# Patient Record
Sex: Male | Born: 1985 | Race: Black or African American | Hispanic: No | Smoking: Never smoker
Health system: Southern US, Community
[De-identification: ages and names within clinical notes are randomized; demographics above are authoritative.]

## PROBLEM LIST (undated history)

## (undated) DIAGNOSIS — F319 Bipolar disorder, unspecified: Secondary | ICD-10-CM

---

## 2016-09-04 ENCOUNTER — Encounter (HOSPITAL_COMMUNITY): Payer: Self-pay | Admitting: Emergency Medicine

## 2016-09-04 DIAGNOSIS — Z79899 Other long term (current) drug therapy: Secondary | ICD-10-CM | POA: Insufficient documentation

## 2016-09-04 DIAGNOSIS — F909 Attention-deficit hyperactivity disorder, unspecified type: Secondary | ICD-10-CM | POA: Insufficient documentation

## 2016-09-04 LAB — CBC WITH DIFFERENTIAL/PLATELET
Basophils Absolute: 0 10*3/uL (ref 0.0–0.1)
Basophils Relative: 0 %
EOS ABS: 0.1 10*3/uL (ref 0.0–0.7)
EOS PCT: 2 %
HCT: 42.3 % (ref 39.0–52.0)
Hemoglobin: 14.6 g/dL (ref 13.0–17.0)
LYMPHS ABS: 2.3 10*3/uL (ref 0.7–4.0)
Lymphocytes Relative: 39 %
MCH: 29.3 pg (ref 26.0–34.0)
MCHC: 34.5 g/dL (ref 30.0–36.0)
MCV: 84.8 fL (ref 78.0–100.0)
MONOS PCT: 5 %
Monocytes Absolute: 0.3 10*3/uL (ref 0.1–1.0)
Neutro Abs: 3.2 10*3/uL (ref 1.7–7.7)
Neutrophils Relative %: 54 %
PLATELETS: 139 10*3/uL — AB (ref 150–400)
RBC: 4.99 MIL/uL (ref 4.22–5.81)
RDW: 13.8 % (ref 11.5–15.5)
WBC: 5.9 10*3/uL (ref 4.0–10.5)

## 2016-09-04 NOTE — ED Triage Notes (Signed)
Pt. requesting psychiatric evaluation for his Bipolar disorder , presents with flight of ideas / unable to focus during encounter ,denies suicidal ideation . No hallucinations .

## 2016-09-04 NOTE — ED Notes (Signed)
Pt approached Nurse first with flight of ideas; Pt stood at desk for a few minutes of rambling; pt redirected to have a seat and wait for Md to evaluate.

## 2016-09-05 ENCOUNTER — Emergency Department (HOSPITAL_COMMUNITY)
Admission: EM | Admit: 2016-09-05 | Discharge: 2016-09-05 | Disposition: A | Discharge status: Home / Self Care | Payer: Self-pay | Attending: Emergency Medicine | Admitting: Emergency Medicine

## 2016-09-05 DIAGNOSIS — F909 Attention-deficit hyperactivity disorder, unspecified type: Secondary | ICD-10-CM

## 2016-09-05 LAB — COMPREHENSIVE METABOLIC PANEL
ALK PHOS: 62 U/L (ref 38–126)
ALT: 17 U/L (ref 17–63)
ANION GAP: 10 (ref 5–15)
AST: 19 U/L (ref 15–41)
Albumin: 4.2 g/dL (ref 3.5–5.0)
BUN: 12 mg/dL (ref 6–20)
CALCIUM: 9.5 mg/dL (ref 8.9–10.3)
CHLORIDE: 105 mmol/L (ref 101–111)
CO2: 23 mmol/L (ref 22–32)
Creatinine, Ser: 0.81 mg/dL (ref 0.61–1.24)
GFR calc non Af Amer: >60 mL/min (ref >60)
GLUCOSE: 97 mg/dL (ref 65–99)
POTASSIUM: 3.6 mmol/L (ref 3.5–5.1)
Sodium: 138 mmol/L (ref 135–145)
Total Bilirubin: 1.5 mg/dL — ABNORMAL HIGH (ref 0.3–1.2)
Total Protein: 7.3 g/dL (ref 6.5–8.1)

## 2016-09-05 LAB — RAPID URINE DRUG SCREEN, HOSP PERFORMED
AMPHETAMINES: NOT DETECTED
BENZODIAZEPINES: NOT DETECTED
Barbiturates: NOT DETECTED
COCAINE: NOT DETECTED
OPIATES: NOT DETECTED
Tetrahydrocannabinol: POSITIVE — AB

## 2016-09-05 LAB — ETHANOL

## 2016-09-05 NOTE — ED Notes (Signed)
Pt manic, pacing in and out of room, advised he had been discharged and  redirected to exit.  Cooperative, pleasant, non violent.

## 2016-09-05 NOTE — ED Provider Notes (Signed)
[facility]-EMERGENCY DEPT Provider Note   CSN: [id number] Arrival date & time: 09/04/16  2255    History   Chief Complaint Chief Complaint  Patient presents with  . Bipolar    HPI Revel Stellmach is a 31 y.o. male.  31 year old male presents to the emergency department for evaluation. The patient's motive for presenting is unclear. He states that he wants to "get checked out". He has rapid speech and is hyperactive. Thoughts are tangential with flight of ideas. Patient denies any complaints of pain. He denies suicidal and homicidal ideations. No auditory or visual hallucinations. He states that he does not wish to speak with a psychiatrist or counselor. He denies being on any daily medications.      History reviewed. No pertinent past medical history.  There are no active problems to display for this patient.   History reviewed. No pertinent surgical history.    Home Medications    Prior to Admission medications   Not on File    Family History No family history on file.  Social History Social History  Substance Use Topics  . Smoking status: Never Smoker  . Smokeless tobacco: Never Used  . Alcohol use No     Allergies   Patient has no known allergies.   Review of Systems Review of Systems Ten systems reviewed and are negative for acute change, except as noted in the HPI.    Physical Exam Updated Vital Signs BP 134/76 (BP Location: Right Arm)   Pulse 62   Temp 97.9 F (36.6 C) (Oral)   Resp 18   SpO2 100%   Physical Exam  Constitutional: He is oriented to person, place, and time. He appears well-developed and well-nourished. No distress.  Nontoxic appearing and in NAD  HENT:  Head: Normocephalic and atraumatic.  Eyes: Conjunctivae and EOM are normal. No scleral icterus.  Neck: Normal range of motion.  Cardiovascular: Normal rate, regular rhythm and intact distal pulses.   Pulmonary/Chest: Effort normal. No respiratory distress. He has no wheezes. He  has no rales.  Lungs CTAB  Musculoskeletal: Normal range of motion.  Neurological: He is alert and oriented to person, place, and time. He exhibits normal muscle tone. Coordination normal.  GCS 15. Ambulatory with steady gait.  Skin: Skin is warm and dry. No rash noted. He is not diaphoretic. No erythema. No pallor.  Psychiatric: His speech is rapid and/or pressured and tangential. He is hyperactive. Cognition and memory are normal. He expresses no homicidal and no suicidal ideation.  Nursing note and vitals reviewed.    ED Treatments / Results  Labs (all labs ordered are listed, but only abnormal results are displayed) Labs Reviewed  RAPID URINE DRUG SCREEN, HOSP PERFORMED - Abnormal; Notable for the following:       Result Value   Tetrahydrocannabinol POSITIVE (*)    All other components within normal limits  CBC WITH DIFFERENTIAL/PLATELET - Abnormal; Notable for the following:    Platelets 139 (*)    All other components within normal limits  COMPREHENSIVE METABOLIC PANEL - Abnormal; Notable for the following:    Total Bilirubin 1.5 (*)    All other components within normal limits  ETHANOL    EKG  EKG Interpretation None       Radiology No results found.  Procedures Procedures (including critical care time)  Medications Ordered in ED Medications - No data to display   Initial Impression / Assessment and Plan / ED Course  I have reviewed the triage vital  signs and the nursing notes.  Pertinent labs & imaging results that were available during my care of the patient were reviewed by me and considered in my medical decision making (see chart for details).     31 year old male presents to the emergency department for medical evaluation. He is noted to be hyperactive with flight of ideas. This is likely secondary to an underlying psychiatric disorder. Patient denies suicidal and homicidal ideations. I do not appreciate him to be a harm to himself or others. He has  had a reassuring laboratory workup.   The patient declines to speak with a psychiatrist or counselor. I do not believe the patient warrants involuntary commitment. He has been provided a Facilities manager for outpatient primary care follow-up and psychiatric care if desired. Return precautions given at discharge. Patient ambulated out of the department in stable condition.   Final Clinical Impressions(s) / ED Diagnoses   Final diagnoses:  Hyperactive behavior    New Prescriptions There are no discharge medications for this patient.    Antony Madura, PA-C 09/05/16 1610    Jacalyn Lefevre, MD 09/05/16 [phone]

## 2016-09-09 ENCOUNTER — Encounter (HOSPITAL_COMMUNITY): Payer: Self-pay | Admitting: Emergency Medicine

## 2016-09-09 ENCOUNTER — Emergency Department (HOSPITAL_COMMUNITY): Payer: Self-pay

## 2016-09-09 ENCOUNTER — Emergency Department (HOSPITAL_COMMUNITY)
Admission: EM | Admit: 2016-09-09 | Discharge: 2016-09-11 | Disposition: A | Discharge status: Home / Self Care | Payer: Self-pay

## 2016-09-09 DIAGNOSIS — Z5181 Encounter for therapeutic drug level monitoring: Secondary | ICD-10-CM | POA: Insufficient documentation

## 2016-09-09 DIAGNOSIS — F3110 Bipolar disorder, current episode manic without psychotic features, unspecified: Secondary | ICD-10-CM | POA: Diagnosis present

## 2016-09-09 DIAGNOSIS — F22 Delusional disorders: Secondary | ICD-10-CM

## 2016-09-09 DIAGNOSIS — F3111 Bipolar disorder, current episode manic without psychotic features, mild: Secondary | ICD-10-CM | POA: Insufficient documentation

## 2016-09-09 DIAGNOSIS — R9431 Abnormal electrocardiogram [ECG] [EKG]: Secondary | ICD-10-CM | POA: Insufficient documentation

## 2016-09-09 LAB — CBC
HCT: 39.6 % (ref 39.0–52.0)
Hemoglobin: 14 g/dL (ref 13.0–17.0)
MCH: 29.9 pg (ref 26.0–34.0)
MCHC: 35.4 g/dL (ref 30.0–36.0)
MCV: 84.6 fL (ref 78.0–100.0)
PLATELETS: 148 10*3/uL — AB (ref 150–400)
RBC: 4.68 MIL/uL (ref 4.22–5.81)
RDW: 13.1 % (ref 11.5–15.5)
WBC: 6.1 10*3/uL (ref 4.0–10.5)

## 2016-09-09 LAB — BASIC METABOLIC PANEL
ANION GAP: 8 (ref 5–15)
BUN: 12 mg/dL (ref 6–20)
CALCIUM: 9.3 mg/dL (ref 8.9–10.3)
CO2: 25 mmol/L (ref 22–32)
CREATININE: 0.86 mg/dL (ref 0.61–1.24)
Chloride: 105 mmol/L (ref 101–111)
GLUCOSE: 105 mg/dL — AB (ref 65–99)
POTASSIUM: 3.5 mmol/L (ref 3.5–5.1)
Sodium: 138 mmol/L (ref 135–145)

## 2016-09-09 LAB — SALICYLATE LEVEL

## 2016-09-09 LAB — I-STAT TROPONIN, ED: TROPONIN I, POC: 0 ng/mL (ref 0.00–0.08)

## 2016-09-09 LAB — ACETAMINOPHEN LEVEL: Acetaminophen (Tylenol), Serum: <10 ug/mL — ABNORMAL LOW (ref 10–30)

## 2016-09-09 LAB — ETHANOL: Alcohol, Ethyl (B): <5 mg/dL (ref <5)

## 2016-09-09 MED ORDER — LORAZEPAM 2 MG/ML IJ SOLN
1.0000 mg | Freq: Once | INTRAMUSCULAR | Status: DC
  Filled 2016-09-09: qty 1
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
## 2016-09-09 NOTE — ED Notes (Signed)
Writer states they went into patients room to introduce themselves and let patient know about what staff needed to do to help patient. One being that we needed to put a cardiac monitor on patient. He refused and said he did not need to be "watched".

## 2016-09-10 DIAGNOSIS — F3110 Bipolar disorder, current episode manic without psychotic features, unspecified: Secondary | ICD-10-CM | POA: Diagnosis present

## 2016-09-10 LAB — RAPID URINE DRUG SCREEN, HOSP PERFORMED
AMPHETAMINES: NOT DETECTED
Barbiturates: NOT DETECTED
Benzodiazepines: NOT DETECTED
Cocaine: NOT DETECTED
Opiates: NOT DETECTED
TETRAHYDROCANNABINOL: POSITIVE — AB

## 2016-09-10 MED ORDER — HALOPERIDOL LACTATE 5 MG/ML IJ SOLN
5.0000 mg | Freq: Once | INTRAMUSCULAR | Status: AC
  Administered 2016-09-10: 5 mg via INTRAMUSCULAR
  Filled 2016-09-10: qty 1

## 2016-09-10 MED ORDER — LORAZEPAM 2 MG/ML IJ SOLN
2.0000 mg | Freq: Once | INTRAMUSCULAR | Status: AC
  Administered 2016-09-10: 2 mg via INTRAVENOUS

## 2016-09-10 MED ORDER — LORAZEPAM 2 MG/ML IJ SOLN: 2.0000 mg | Freq: Once | INTRAMUSCULAR | Status: DC

## 2016-09-10 MED ORDER — LORAZEPAM 1 MG PO TABS: 1.0000 mg | ORAL_TABLET | Freq: Three times a day (TID) | ORAL | Status: DC | PRN

## 2016-09-10 MED ORDER — DIPHENHYDRAMINE HCL 50 MG/ML IJ SOLN
50.0000 mg | Freq: Once | INTRAMUSCULAR | Status: DC | PRN
Start: 2016-09-10 — End: 2016-09-11

## 2016-09-10 MED ORDER — DIPHENHYDRAMINE HCL 50 MG/ML IJ SOLN: 50.0000 mg | Freq: Once | INTRAMUSCULAR | Status: DC

## 2016-09-10 MED ORDER — ASENAPINE MALEATE 5 MG SL SUBL
10.0000 mg | SUBLINGUAL_TABLET | Freq: Two times a day (BID) | SUBLINGUAL | Status: DC
  Filled 2016-09-10 (×2): qty 2

## 2016-09-10 MED ORDER — ZIPRASIDONE MESYLATE 20 MG IM SOLR: 20.0000 mg | Freq: Once | INTRAMUSCULAR | Status: DC | PRN

## 2016-09-10 MED ORDER — LORAZEPAM 2 MG/ML IJ SOLN: 2.0000 mg | Freq: Once | INTRAMUSCULAR | Status: DC | PRN

## 2016-09-10 MED ORDER — ZIPRASIDONE MESYLATE 20 MG IM SOLR: 20.0000 mg | Freq: Once | INTRAMUSCULAR | Status: DC

## 2016-09-10 MED ORDER — GABAPENTIN 100 MG PO CAPS
200.0000 mg | ORAL_CAPSULE | Freq: Two times a day (BID) | ORAL | Status: DC
  Filled 2016-09-10 (×2): qty 2

## 2016-09-10 NOTE — ED Notes (Signed)
On the phone 

## 2016-09-10 NOTE — ED Notes (Signed)
Bed: W[facility]37 Expected date:  Expected time:  Means of arrival:  Comments: Room 30 

## 2016-09-10 NOTE — ED Notes (Signed)
Eating supper.

## 2016-09-10 NOTE — ED Notes (Signed)
Report given to [employer]. Pt transferred.

## 2016-09-10 NOTE — ED Notes (Signed)
Up talking to [facility] in hall

## 2016-09-10 NOTE — ED Notes (Signed)
Up to the desk wanting to use his lap top, to know how long he will be here, pt them turned and returned to his room.

## 2016-09-10 NOTE — ED Notes (Signed)
ekg report given to provider
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
## 2016-09-10 NOTE — ED Notes (Signed)
Snack give, pt laying on bed w/ eyes closed, resp even and unlabored

## 2016-09-10 NOTE — ED Notes (Signed)
Up tot he bathroom to shower and change scrubs 

## 2016-09-10 NOTE — ED Notes (Signed)
Pt became manic and was IVC by provider

## 2016-09-10 NOTE — BH Assessment (Signed)
[facility] Assessment Progress Note  Per Thedore Mins, MD, this pt requires psychiatric hospitalization at this time.  The following facilities have been contacted to seek placement for this pt, with results as noted:  Beds available, information sent, decision pending:  [facility]   At capacity:  [facility] (no male beds) Paulette Blanch, [place] Triage Specialist [phone]
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
## 2016-09-10 NOTE — Progress Notes (Signed)
09/10/16 1354:  LRT went to pt room to offer activities, pt was sleep.  Caroll Rancher, LRT/CTRS

## 2016-09-10 NOTE — ED Notes (Addendum)
Up to the bathroom, ambulatory w/o difficulty 

## 2016-09-10 NOTE — ED Notes (Addendum)
Pt laying quietly on bed w/ eyes closed.  Pt had been escorted to room 37 by security and [employer] and had declined to take his medications.  When asked about taking medications pt did not respond to question.  Pt declined to answer questions other than he needed another blanket, when he indicated that he did by nodding his head.

## 2016-09-10 NOTE — ED Notes (Signed)
David Gay, TTS, in evaluating pt

## 2016-09-10 NOTE — ED Notes (Signed)
Bed: WA30 Expected date:  Expected time:  Means of arrival:  Comments: Hold for 15 

## 2016-09-10 NOTE — ED Notes (Signed)
Pt awake, alert & responsive, no distress noted, calm at present.  Pt rambling during conversation.  Monitoring for safety, Q 15 min checks in effect.
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
## 2016-09-10 NOTE — ED Notes (Signed)
Bed: WBH38 Expected date:  Expected time:  Means of arrival:  Comments: Seclusion room 

## 2016-09-10 NOTE — ED Provider Notes (Signed)
Pt now walking around ER, yelling/agitated, a threat to himself and others  IVC completed Haldol/ativan ordered for patient   CRITICAL CARE Performed by: Joya Gaskins Total critical care time: 31 minutes Critical care time was exclusive of separately billable procedures and treating other patients. Critical care was necessary to treat or prevent imminent or life-threatening deterioration. Critical care was time spent personally by me on the following activities: development of treatment plan with patient and/or surrogate as well as nursing, discussions with consultants, evaluation of patient's response to treatment, examination of patient, obtaining history from patient or surrogate, ordering and performing treatments and interventions, ordering and review of laboratory studies, ordering and review of radiographic studies, pulse oximetry and re-evaluation of patient's condition.    Zadie Rhine, MD 09/10/16 [phone]

## 2016-09-10 NOTE — BH Assessment (Addendum)
Tele Assessment Note   David Gay is an 31 y.o. male.  -Clinician reviewed note by Dr. Bebe Shaggy. David Gay is a 31 y.o. male with hx of bipolar who presents to the Emergency Department bib [facility] for a mental evaluation with onset x2 hours. 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 Pt states that "they say I'm bipolar but I'm not." He states that he is similar to Solis in the fact that his mind works differently. Pt denies any modifying factors. He denies chest pain. Of note, pt states that he has not slept in x5 days and called his mom for money for a hotel room.  Patient has pressured, rapid speech.  He has to slow speech at times to be understood.  Patient has no coherence in what he describes happened today.  He mentioned a stranger picking him up today and taking him to the [employer] store.  He describes "going ape-shit" on an employee at the [employer].  Later in the day as he was heading to [employer] he says his phone and Facebook page "got zapped."  He believes that other people are responsible for this.  Patient is separated from his wife of 2.5 years.  Patient says that there is a court date of May 26 regarding his wife.  Patient tears up a bit talking about her.  Patient has a restraining order from her but he is unclear on this.  Patient denies any SI, HI or hearing or seeing things.  He is however very manic, speaks rapidly and incoherently.  He talks about how others are jealous of him.  He also can make people millions of dollars if they listen to his ideas.  He talks about how he is the smartest person around and how he will end up working for [employer].  Patient talks positively about using marijuana but he cannot concentrate long enough to tell when the last time he used it was.    Patient recounts the different times he has gotten fired from jobs.  Patient has had previous  hospitalizations but cannot name when or where readily.   Patient has no current outpatient provider at this time and is not on any psychiatric drugs.  Patient says he has had a previous dx of bipolar d/o.  Patient was in [facility] on 09/05/16 because he had no place else to go.  -Clinician discussed patient care with Donell Sievert, PA who recommends inpatient care.  TTS to seek placement.  Diagnosis: Bipolar d/o mania  Past Medical History: History reviewed. No pertinent past medical history.  History reviewed. No pertinent surgical history.  Family History: No family history on file.  Social History:  reports that he has never smoked. He has never used smokeless tobacco. He reports that he does not drink alcohol or use drugs.  Additional Social History:  Alcohol / Drug Use Pain Medications: None Prescriptions: "I hate medication" Over the Counter: None History of alcohol / drug use?: Yes Substance #1 Name of Substance 1: Marijuana 1 - Age of First Use: 31 years of age 5 - Amount (size/oz): A blunt when I can get it 1 - Frequency: Varies 1 - Duration: ongoing 1 - Last Use / Amount: Won't say.  CIWA: CIWA-Ar BP: 129/75 Pulse Rate: (!) 55 COWS:    PATIENT STRENGTHS: (choose at least two) Average or above average intelligence Capable  of independent living Communication skills Supportive family/friends  Allergies: No Known Allergies  Home Medications:  (Not in a hospital admission)  OB/GYN Status:  No LMP for male patient.  General Assessment Data Location of Assessment: [facility] TTS Assessment: In system Is this a Tele or Face-to-Face Assessment?: Face-to-Face Is this an Initial Assessment or a Re-assessment for this encounter?: Initial Assessment Marital status: Separated Is patient pregnant?: No Pregnancy Status: No Living Arrangements: Other (Comment) (Homeless currently) Can pt return to current living arrangement?: Yes Admission Status: Voluntary Is patient  capable of signing voluntary admission?: Yes Referral Source: Self/Family/Friend (Patient had contacted EMS to come to [facility] Insurance type: self pay     Crisis Care Plan Living Arrangements: Other (Comment) (Homeless currently) Name of Psychiatrist: None Name of Therapist: None  Education Status Is patient currently in school?: No Highest grade of school patient has completed: Some college  Risk to self with the past 6 months Suicidal Ideation: No Has patient been a risk to self within the past 6 months prior to admission? : No Suicidal Intent: No Has patient had any suicidal intent within the past 6 months prior to admission? : No Is patient at risk for suicide?: No Suicidal Plan?: No Has patient had any suicidal plan within the past 6 months prior to admission? : No Access to Means: No What has been your use of drugs/alcohol within the last 12 months?: THC Previous Attempts/Gestures: No How many times?: 0 Other Self Harm Risks: None Triggers for Past Attempts: None known Intentional Self Injurious Behavior: None Family Suicide History: No Recent stressful life event(s): Divorce, Turmoil (Comment) (Presents with paranoid thoughts) Persecutory voices/beliefs?: Yes Depression: Yes Depression Symptoms: Loss of interest in usual pleasures, Insomnia, Tearfulness Substance abuse history and/or treatment for substance abuse?: No Suicide prevention information given to non-admitted patients: Not applicable  Risk to Others within the past 6 months Homicidal Ideation: No Does patient have any lifetime risk of violence toward others beyond the six months prior to admission? : No Thoughts of Harm to Others: No Current Homicidal Intent: No Current Homicidal Plan: No Access to Homicidal Means: No Identified Victim: No one History of harm to others?: No Assessment of Violence: None Noted Violent Behavior Description: Pt denies Does patient have access to weapons?: No Criminal  Charges Pending?: No Does patient have a court date: No Is patient on probation?: No  Psychosis Hallucinations: None noted Delusions: Persecutory, Grandiose  Mental Status Report Appearance/Hygiene: In scrubs Eye Contact: Good Motor Activity: Freedom of movement, Unremarkable Speech: Incoherent, Rapid, Pressured Level of Consciousness: Alert Mood: Anxious, Suspicious, Apprehensive, Preoccupied Affect: Anxious, Apprehensive, Preoccupied Anxiety Level: Moderate Thought Processes: Irrelevant, Flight of Ideas Judgement: Unimpaired Orientation: Person, Place Obsessive Compulsive Thoughts/Behaviors: Severe  Cognitive Functioning Concentration: Normal Memory: Remote Intact, Recent Impaired IQ: Average Insight: Poor Impulse Control: Poor Appetite: Good Weight Loss: 0 Weight Gain: 0 Sleep: Decreased Total Hours of Sleep:  (Off and on <5H/D) Vegetative Symptoms: None  ADLScreening [facility] Assessment Services) Patient's cognitive ability adequate to safely complete daily activities?: Yes Patient able to express need for assistance with ADLs?: Yes Independently performs ADLs?: Yes (appropriate for developmental age)  Prior Inpatient Therapy Prior Inpatient Therapy: Yes Prior Therapy Dates: Pt unclear Prior Therapy Facilty/Provider(s): Unknown Reason for Treatment: Pt has psychosis  Prior Outpatient Therapy Prior Outpatient Therapy: No Prior Therapy Dates: None Prior Therapy Facilty/Provider(s): None Reason for Treatment: None Does patient have an ACCT team?: No Does patient have Intensive In-House Services?  : No  Does patient have [facility] services? : No Does patient have P4CC services?: No  ADL Screening (condition at time of admission) Patient's cognitive ability adequate to safely complete daily activities?: Yes Is the patient deaf or have difficulty hearing?: No Does the patient have difficulty seeing, even when wearing glasses/contacts?: No Does the patient have  difficulty concentrating, remembering, or making decisions?: No Patient able to express need for assistance with ADLs?: Yes Does the patient have difficulty dressing or bathing?: No Independently performs ADLs?: Yes (appropriate for developmental age) Does the patient have difficulty walking or climbing stairs?: No Weakness of Legs: None Weakness of Arms/Hands: None       Abuse/Neglect Assessment (Assessment to be complete while patient is alone) Physical Abuse: Denies Verbal Abuse: Denies Sexual Abuse: Denies Exploitation of patient/patient's resources: Denies Self-Neglect: Denies     Merchant navy officer (For Healthcare) Does Patient Have a Medical Advance Directive?: No Would patient like information on creating a medical advance directive?: No - Patient declined    Additional Information 1:1 In Past 12 Months?: No CIRT Risk: No Elopement Risk: No Does patient have medical clearance?: Yes     Disposition:  Disposition Initial Assessment Completed for this Encounter: Yes Disposition of Patient: Other dispositions Other disposition(s): Other (Comment) (Pt to be reviewed by PA)  Alexandria Lodge 09/10/2016 2:19 AM

## 2016-09-10 NOTE — ED Notes (Signed)
Parents Chrissie Noa and Jayvion Stefanski called from the [facility] requesting that the patient be IVC'd, it was explained to them that our ED physician had to evaluate and determine if that was necessary at this time.   David Gay [phone] father

## 2016-09-10 NOTE — ED Notes (Signed)
Pt awake, no c/o pain. Pt states he needs all of his media devices. Explained to patient that these items have been locked up and secure. Pt made aware of the policy regarding media devices and IVC'd patients. Pt agitated and difficulty focusing on tasks.

## 2016-09-10 NOTE — ED Notes (Signed)
Pt gowned in paper scrubs, wand ed by security and items collected and bagged per IVC

## 2016-09-10 NOTE — ED Notes (Signed)
Pt refused to take his scheduled meds , pt is manic cussing at this Clinical research associate. Pt using word salad obviously manic. Verbally and physically aggressive. [employer] at bedside. Pt will be moved to [facility].

## 2016-09-11 DIAGNOSIS — F3111 Bipolar disorder, current episode manic without psychotic features, mild: Secondary | ICD-10-CM

## 2016-09-11 MED ORDER — GABAPENTIN 100 MG PO CAPS: 200.0000 mg | ORAL_CAPSULE | Freq: Two times a day (BID) | ORAL | 0 refills | Status: AC

## 2016-09-11 MED ORDER — ASENAPINE MALEATE 5 MG SL SUBL: 10.0000 mg | SUBLINGUAL_TABLET | Freq: Two times a day (BID) | SUBLINGUAL | 0 refills | Status: AC

## 2016-09-11 NOTE — BH Assessment (Signed)
[facility] Assessment Progress Note  Per Thedore Mins, MD, this pt does not require psychiatric hospitalization at this time.  Pt presents under IVC initiated by EDP Zadie Rhine, MD, which Dr Jannifer Franklin has rescinded.  Pt is to be discharged from [facility] with recommendation to follow up with [facility].  This has been included in pt's discharge instructions.  Pt's nurse, Dawnaly, has been notified.  Doylene Canning, MA Triage Specialist [phone]

## 2016-09-11 NOTE — Consult Note (Signed)
[facility] Face-to-Face Psychiatry Consult   Reason for Consult:  Delusional,  paranoia Referring Physician:  EDP Patient Identification: David Gay MRN:  [id number] Principal Diagnosis: Bipolar affective disorder, current episode manic ([place]) Diagnosis:   Patient Active Problem List   Diagnosis Date Noted  . Bipolar affective disorder, current episode manic ([place]) [F31.9] 09/10/2016    Priority: Medium    Total Time spent with patient: 45 minutes  Subjective:   David Gay is a 31 y.o. male patient does not warrant admission.  HPI:  31 yo male who presented to the ED with delusions of grandeur and increase in activity.  He claims he was requesting help from the police for theft and they sent him here.  David Gay was started on medications and he stabilized.  No voices or hallucinations or paranoia.  No suicidal/homicidal ideations.  A bit grandiose with increase energy but stable for discharge.  Past Psychiatric History: bipolar disorder  Risk to Self: Suicidal Ideation: No Suicidal Intent: No Is patient at risk for suicide?: No Suicidal Plan?: No Access to Means: No What has been your use of drugs/alcohol within the last 12 months?: THC How many times?: 0 Other Self Harm Risks: None Triggers for Past Attempts: None known Intentional Self Injurious Behavior: None Risk to Others: Homicidal Ideation: No Thoughts of Harm to Others: No Current Homicidal Intent: No Current Homicidal Plan: No Access to Homicidal Means: No Identified Victim: No one History of harm to others?: No Assessment of Violence: None Noted Violent Behavior Description: Pt denies Does patient have access to weapons?: No Criminal Charges Pending?: No Does patient have a court date: No Prior Inpatient Therapy: Prior Inpatient Therapy: Yes Prior Therapy Dates: Pt unclear Prior Therapy Facilty/Provider(s): Unknown Reason for Treatment: Pt has psychosis Prior Outpatient Therapy: Prior Outpatient Therapy:  No Prior Therapy Dates: None Prior Therapy Facilty/Provider(s): None Reason for Treatment: None Does patient have an ACCT team?: No Does patient have Intensive In-House Services?  : No Does patient have [facility] services? : No Does patient have P4CC services?: No  Past Medical History: History reviewed. No pertinent past medical history. History reviewed. No pertinent surgical history. Family History: No family history on file. Family Psychiatric  History: unknown Social History:  History  Alcohol Use No     History  Drug Use No    Social History   Social History  . Marital status: Unknown    Spouse name: N/A  . Number of children: N/A  . Years of education: N/A   Social History Main Topics  . Smoking status: Never Smoker  . Smokeless tobacco: Never Used  . Alcohol use No  . Drug use: No  . Sexual activity: Not Asked   Other Topics Concern  . None   Social History Narrative  . None   Additional Social History:    Allergies:  No Known Allergies  Labs:  Results for orders placed or performed during the hospital encounter of 09/09/16 (from the past 48 hour(s))  Basic metabolic panel     Status: Abnormal   Collection Time: 09/09/16 10:40 PM  Result Value Ref Range   Sodium 138 135 - 145 mmol/L   Potassium 3.5 3.5 - 5.1 mmol/L   Chloride 105 101 - 111 mmol/L   CO2 25 22 - 32 mmol/L   Glucose, Bld 105 (H) 65 - 99 mg/dL   BUN 12 6 - 20 mg/dL   Creatinine, Ser 0.86 0.61 - 1.24 mg/dL   Calcium 9.3 8.9 - 10.3  mg/dL   GFR calc non Af Amer >60 >60 mL/min   GFR calc Af Amer >60 >60 mL/min    Comment: (NOTE) The eGFR has been calculated using the CKD EPI equation. This calculation has not been validated in all clinical situations. eGFR's persistently <60 mL/min signify possible Chronic Kidney Disease.    Anion gap 8 5 - 15  CBC     Status: Abnormal   Collection Time: 09/09/16 10:40 PM  Result Value Ref Range   WBC 6.1 4.0 - 10.5 K/uL   RBC 4.68 4.22 - 5.81  MIL/uL   Hemoglobin 14.0 13.0 - 17.0 g/dL   HCT 39.6 39.0 - 52.0 %   MCV 84.6 78.0 - 100.0 fL   MCH 29.9 26.0 - 34.0 pg   MCHC 35.4 30.0 - 36.0 g/dL   RDW 13.1 11.5 - 15.5 %   Platelets 148 (L) 150 - 400 K/uL  I-stat troponin, ED     Status: None   Collection Time: 09/09/16 10:55 PM  Result Value Ref Range   Troponin i, poc 0.00 0.00 - 0.08 ng/mL   Comment 3            Comment: Due to the release kinetics of cTnI, a negative result within the first hours of the onset of symptoms does not rule out myocardial infarction with certainty. If myocardial infarction is still suspected, repeat the test at appropriate intervals.   Ethanol     Status: None   Collection Time: 09/09/16 11:10 PM  Result Value Ref Range   Alcohol, Ethyl (B) <5 <5 mg/dL    Comment:        LOWEST DETECTABLE LIMIT FOR SERUM ALCOHOL IS 5 mg/dL FOR MEDICAL PURPOSES ONLY   Salicylate level     Status: None   Collection Time: 09/09/16 11:10 PM  Result Value Ref Range   Salicylate Lvl <4.0 2.8 - 30.0 mg/dL  Acetaminophen level     Status: Abnormal   Collection Time: 09/09/16 11:10 PM  Result Value Ref Range   Acetaminophen (Tylenol), Serum <10 (L) 10 - 30 ug/mL    Comment:        THERAPEUTIC CONCENTRATIONS VARY SIGNIFICANTLY. A RANGE OF 10-30 ug/mL MAY BE AN EFFECTIVE CONCENTRATION FOR MANY PATIENTS. HOWEVER, SOME ARE BEST TREATED AT CONCENTRATIONS OUTSIDE THIS RANGE. ACETAMINOPHEN CONCENTRATIONS >150 ug/mL AT 4 HOURS AFTER INGESTION AND >50 ug/mL AT 12 HOURS AFTER INGESTION ARE OFTEN ASSOCIATED WITH TOXIC REACTIONS.   Rapid urine drug screen (hospital performed)     Status: Abnormal   Collection Time: 09/10/16 12:26 AM  Result Value Ref Range   Opiates NONE DETECTED NONE DETECTED   Cocaine NONE DETECTED NONE DETECTED   Benzodiazepines NONE DETECTED NONE DETECTED   Amphetamines NONE DETECTED NONE DETECTED   Tetrahydrocannabinol POSITIVE (A) NONE DETECTED   Barbiturates NONE DETECTED NONE DETECTED     Comment:        DRUG SCREEN FOR MEDICAL PURPOSES ONLY.  IF CONFIRMATION IS NEEDED FOR ANY PURPOSE, NOTIFY LAB WITHIN 5 DAYS.        LOWEST DETECTABLE LIMITS FOR URINE DRUG SCREEN Drug Class       Cutoff (ng/mL) Amphetamine      1000 Barbiturate      200 Benzodiazepine   814 Tricyclics       481 Opiates          300 Cocaine          300 THC  50     Current Facility-Administered Medications  Medication Dose Route Frequency Provider Last Rate Last Dose  . asenapine (SAPHRIS) sublingual tablet 10 mg  10 mg Sublingual BID  , MD      . diphenhydrAMINE (BENADRYL) injection 50 mg  50 mg Intramuscular Once PRN Patrecia Pour, NP      . gabapentin (NEURONTIN) capsule 200 mg  200 mg Oral BID  , MD      . LORazepam (ATIVAN) injection 2 mg  2 mg Intramuscular Once PRN Patrecia Pour, NP      . ziprasidone (GEODON) injection 20 mg  20 mg Intramuscular Once PRN Patrecia Pour, NP       No current outpatient prescriptions on file.    Musculoskeletal: Strength & Muscle Tone: within normal limits Gait & Station: normal Patient leans: N/A  Psychiatric Specialty Exam: Physical Exam  Constitutional: He is oriented to person, place, and time. He appears well-developed and well-nourished.  HENT:  Head: Normocephalic.  Neck: Normal range of motion.  Respiratory: Effort normal.  Musculoskeletal: Normal range of motion.  Neurological: He is alert and oriented to person, place, and time.  Psychiatric: He has a normal mood and affect. His speech is normal and behavior is normal. Judgment and thought content normal. Cognition and memory are normal.    Review of Systems  All other systems reviewed and are negative.   Blood pressure 129/77, pulse (!) 58, temperature 97.8 F (36.6 C), temperature source Oral, resp. rate 18, SpO2 99 %.There is no height or weight on file to calculate BMI.  General Appearance: Casual  Eye Contact:  Good  Speech:  Normal  Rate  Volume:  Normal  Mood:  Euphoric  Affect:  Congruent  Thought Process:  Coherent and Descriptions of Associations: Intact  Orientation:  Full (Time, Place, and Person)  Thought Content:  WDL and Logical  Suicidal Thoughts:  No  Homicidal Thoughts:  No  Memory:  Immediate;   Good Recent;   Good Remote;   Good  Judgement:  Fair  Insight:  Fair  Psychomotor Activity:  Normal  Concentration:  Concentration: Good and Attention Span: Good  Recall:  Good  Fund of Knowledge:  Good  Language:  Good  Akathisia:  No  Handed:  Right  AIMS (if indicated):     Assets:  Leisure Time Physical Health Resilience Social Support  ADL's:  Intact  Cognition:  WNL  Sleep:        Treatment Plan Summary: Daily contact with patient to assess and evaluate symptoms and progress in treatment, Medication management and Plan bipolar affective disorder, mania, mild:  -Crisis stabilization -Medication management:  Continued Saphris 5 mg BID for mood stabilization and gabapentin 200 mg BID for mood/anxiety -Individual counseling  Disposition: No evidence of imminent risk to self or others at present.    Waylan Boga, NP 09/11/2016 11:09 AM  Patient seen face-to-face for psychiatric evaluation, chart reviewed and case discussed with the physician extender and developed treatment plan. Reviewed the information documented and agree with the treatment plan. Corena Pilgrim, MD

## 2016-09-11 NOTE — Discharge Instructions (Signed)
For your ongoing mental health needs, you are advised to follow up with [facility].  New and returning patients are seen at their walk-in clinic.  Walk-in hours are Monday - Friday from 8:00 am - 3:00 pm.  Walk-in patients are seen on a first come, first served basis.  Try to arrive as early as possible for he best chance of being seen the same day: ° °     [facility] °     [address] °     [place], [place] [address] °     [phone] °

## 2016-09-11 NOTE — [facility] Suicide Risk Assessment (Signed)
Suicide Risk Assessment  Discharge Assessment   [facility] Discharge Suicide Risk Assessment   Principal Problem: Bipolar affective disorder, current episode manic [facility]) Discharge Diagnoses:  Patient Active Problem List   Diagnosis Date Noted  . Bipolar affective disorder, current episode manic ([facility]) [F31.9] 09/10/2016    Priority: Medium    Total Time spent with patient: 45 minutes  Musculoskeletal: Strength & Muscle Tone: within normal limits Gait & Station: normal Patient leans: N/A  Psychiatric Specialty Exam: Physical Exam  Constitutional: He is oriented to person, place, and time. He appears well-developed and well-nourished.  HENT:  Head: Normocephalic.  Neck: Normal range of motion.  Respiratory: Effort normal.  Musculoskeletal: Normal range of motion.  Neurological: He is alert and oriented to person, place, and time.  Psychiatric: He has a normal mood and affect. His speech is normal and behavior is normal. Judgment and thought content normal. Cognition and memory are normal.    Review of Systems  All other systems reviewed and are negative.   Blood pressure 129/77, pulse (!) 58, temperature 97.8 F (36.6 C), temperature source Oral, resp. rate 18, SpO2 99 %.There is no height or weight on file to calculate BMI.  General Appearance: Casual  Eye Contact:  Good  Speech:  Normal Rate  Volume:  Normal  Mood:  Euphoric  Affect:  Congruent  Thought Process:  Coherent and Descriptions of Associations: Intact  Orientation:  Full (Time, Place, and Person)  Thought Content:  WDL and Logical  Suicidal Thoughts:  No  Homicidal Thoughts:  No  Memory:  Immediate;   Good Recent;   Good Remote;   Good  Judgement:  Fair  Insight:  Fair  Psychomotor Activity:  Normal  Concentration:  Concentration: Good and Attention Span: Good  Recall:  Good  Fund of Knowledge:  Good  Language:  Good  Akathisia:  No  Handed:  Right  AIMS (if indicated):     Assets:  Leisure  Time Physical Health Resilience Social Support  ADL's:  Intact  Cognition:  WNL  Sleep:       Mental Status Per Nursing Assessment::   On Admission:   mania  Demographic Factors:  Male  Loss Factors: NA  Historical Factors: NA  Risk Reduction Factors:   Sense of responsibility to family and Positive social support  Continued Clinical Symptoms:  None   Cognitive Features That Contribute To Risk:  None    Suicide Risk:  Minimal: No identifiable suicidal ideation.  Patients presenting with no risk factors but with morbid ruminations; may be classified as minimal risk based on the severity of the depressive symptoms  Follow-up Information    [facility] In 1 week.   Specialty:  Cardiology Contact information: [address], [address] [place] [address] [phone]          Plan Of Care/Follow-up recommendations:  Activity:  as tolerated Diet:  heart healthy diet  Edlyn Rosenburg, NP 09/11/2016, 2:45 PM

## 2016-09-11 NOTE — ED Notes (Signed)
Patient discharged to home.  He denies any thoughts of harm to self or others.  He denies auditory or visual hallucinations.  Left the unit ambulatory.  All belongings returned and signed for.

## 2016-09-30 ENCOUNTER — Emergency Department (HOSPITAL_COMMUNITY)
Admission: EM | Admit: 2016-09-30 | Discharge: 2016-09-30 | Disposition: A | Discharge status: Home / Self Care | Payer: Self-pay | Attending: Emergency Medicine | Admitting: Emergency Medicine

## 2016-09-30 ENCOUNTER — Encounter (HOSPITAL_COMMUNITY): Payer: Self-pay

## 2016-09-30 ENCOUNTER — Other Ambulatory Visit: Payer: Self-pay

## 2016-09-30 ENCOUNTER — Emergency Department (HOSPITAL_COMMUNITY): Payer: Self-pay

## 2016-09-30 DIAGNOSIS — F309 Manic episode, unspecified: Secondary | ICD-10-CM | POA: Insufficient documentation

## 2016-09-30 HISTORY — DX: Bipolar disorder, unspecified: F31.9

## 2016-09-30 LAB — CBC
HCT: 37.1 % — ABNORMAL LOW (ref 39.0–52.0)
HEMOGLOBIN: 12.9 g/dL — AB (ref 13.0–17.0)
MCH: 29.8 pg (ref 26.0–34.0)
MCHC: 34.8 g/dL (ref 30.0–36.0)
MCV: 85.7 fL (ref 78.0–100.0)
RBC: 4.33 MIL/uL (ref 4.22–5.81)
RDW: 13.2 % (ref 11.5–15.5)
WBC: 4.5 10*3/uL (ref 4.0–10.5)

## 2016-09-30 LAB — BASIC METABOLIC PANEL
ANION GAP: 5 (ref 5–15)
BUN: 10 mg/dL (ref 6–20)
CALCIUM: 9.1 mg/dL (ref 8.9–10.3)
CO2: 24 mmol/L (ref 22–32)
Chloride: 108 mmol/L (ref 101–111)
Creatinine, Ser: 0.84 mg/dL (ref 0.61–1.24)
GFR calc Af Amer: >60 mL/min (ref >60)
GLUCOSE: 87 mg/dL (ref 65–99)
Potassium: 3.8 mmol/L (ref 3.5–5.1)
SODIUM: 137 mmol/L (ref 135–145)

## 2016-09-30 LAB — I-STAT TROPONIN, ED: TROPONIN I, POC: 0 ng/mL (ref 0.00–0.08)

## 2016-09-30 NOTE — Discharge Instructions (Signed)
Follow up with your mental health professional

## 2016-09-30 NOTE — ED Notes (Signed)
Went over discharge instructions with patient pt leaving in police custody

## 2016-09-30 NOTE — ED Triage Notes (Signed)
Pt arrives via ems in police custody having 2/10 chest pain. Pt states he wants 1 million test so he doesn't go to jail per ems.   18LFA  1ntg 4asa Hr 69 sinus bp- 112/60

## 2016-09-30 NOTE — ED Provider Notes (Signed)
[facility]-EMERGENCY DEPT Provider Note   CSN: [id number] Arrival date & time: 09/30/16  2046     History   Chief Complaint No chief complaint on file.   HPI Lonzell Dorris is a 31 y.o. male.  This a 31 year old male brought in by the police after being arrested for assault and public nuisance, stating that he now has chest discomfort.  He states that his chest.  Swells because he talks amount that he has a lump in each armpit that he noticed he thinks maybe a week ago but isn't sure, also states that they're not sore, tender on the right to make that is sure that his circulation is okay because he is concerned his blood is flowing properly, will to know why he talks so much.      Past Medical History:  Diagnosis Date  . Bipolar 1 disorder [facility])     Patient Active Problem List   Diagnosis Date Noted  . Bipolar affective disorder, current episode manic (HCC) 09/10/2016    History reviewed. No pertinent surgical history.     Home Medications    Prior to Admission medications   Medication Sig Start Date End Date Taking? Authorizing Provider  asenapine (SAPHRIS) 5 MG SUBL 24 hr tablet Place 2 tablets (10 mg total) under the tongue 2 (two) times daily. 09/11/16   Charm Rings, NP  gabapentin (NEURONTIN) 100 MG capsule Take 2 capsules (200 mg total) by mouth 2 (two) times daily. 09/11/16   Charm Rings, NP    Family History No family history on file.  Social History Social History  Substance Use Topics  . Smoking status: Never Smoker  . Smokeless tobacco: Never Used  . Alcohol use No     Allergies   Patient has no known allergies.   Review of Systems Review of Systems  Constitutional: Negative for fever.  Respiratory: Negative for cough and shortness of breath.   Cardiovascular: Positive for chest pain.  Neurological: Negative for dizziness and headaches.  Psychiatric/Behavioral: Negative for suicidal ideas. The patient is hyperactive.   All other systems  reviewed and are negative.    Physical Exam Updated Vital Signs BP 120/79   Pulse (!) 54   Temp 97.6 F (36.4 C) (Oral)   Resp 19   SpO2 98%   Physical Exam  Constitutional: He is oriented to person, place, and time. He appears well-developed and well-nourished.  HENT:  Head: Normocephalic.  Eyes: Pupils are equal, round, and reactive to light.  Neck: Normal range of motion.  Cardiovascular: Normal rate and regular rhythm.   Pulmonary/Chest: Effort normal and breath sounds normal.  Abdominal: Soft. Bowel sounds are normal.  Neurological: He is alert and oriented to person, place, and time.  Skin: Skin is warm.  Psychiatric: His mood appears anxious. His speech is rapid and/or pressured and tangential. He is agitated. Thought content is delusional. Cognition and memory are impaired. He expresses impulsivity and inappropriate judgment. He expresses no homicidal and no suicidal ideation.  Nursing note and vitals reviewed.    ED Treatments / Results  Labs (all labs ordered are listed, but only abnormal results are displayed) Labs Reviewed  CBC - Abnormal; Notable for the following:       Result Value   Hemoglobin 12.9 (*)    HCT 37.1 (*)    All other components within normal limits  BASIC METABOLIC PANEL  CBC  I-STAT TROPOININ, ED    EKG  EKG Interpretation None  Radiology Dg Chest 2 View  Result Date: 09/30/2016 CLINICAL DATA:  Chest pain. EXAM: CHEST  2 VIEW COMPARISON:  09/09/2016 FINDINGS: The cardiomediastinal contours are normal. The lungs are clear. Pulmonary vasculature is normal. No consolidation, pleural effusion, or pneumothorax. No acute osseous abnormalities are seen. IMPRESSION: No acute abnormality. Electronically Signed   By: Rubye OaksMelanie  Ehinger M.D.   On: 09/30/2016 21:56    Procedures Procedures (including critical care time)  Medications Ordered in ED Medications - No data to display   Initial Impression / Assessment and Plan / ED Course   I have reviewed the triage vital signs and the nursing notes.  Pertinent labs & imaging results that were available during my care of the patient were reviewed by me and considered in my medical decision making (see chart for details).     Patient appears to be manic tangential in his conversation, labile in his mood. Since labs within normal parameters.  He's been given referral to outpatient therapy  Final Clinical Impressions(s) / ED Diagnoses   Final diagnoses:  [facility](HCC)    New Prescriptions New Prescriptions   No medications on file     Earley FavorSchulz, Jatniel Verastegui, NP 09/30/16 [id number]    Arby BarrettePfeiffer, Marcy, MD 10/04/16 [phone]

## 2018-11-22 IMAGING — CR DG CHEST 2V
2 series · 2 of 2 positions shown · non-contrast
Comparison: None.

CLINICAL DATA: Mid chest pain and palpitations.

EXAM:
CHEST  2 VIEW

[w chest pa]
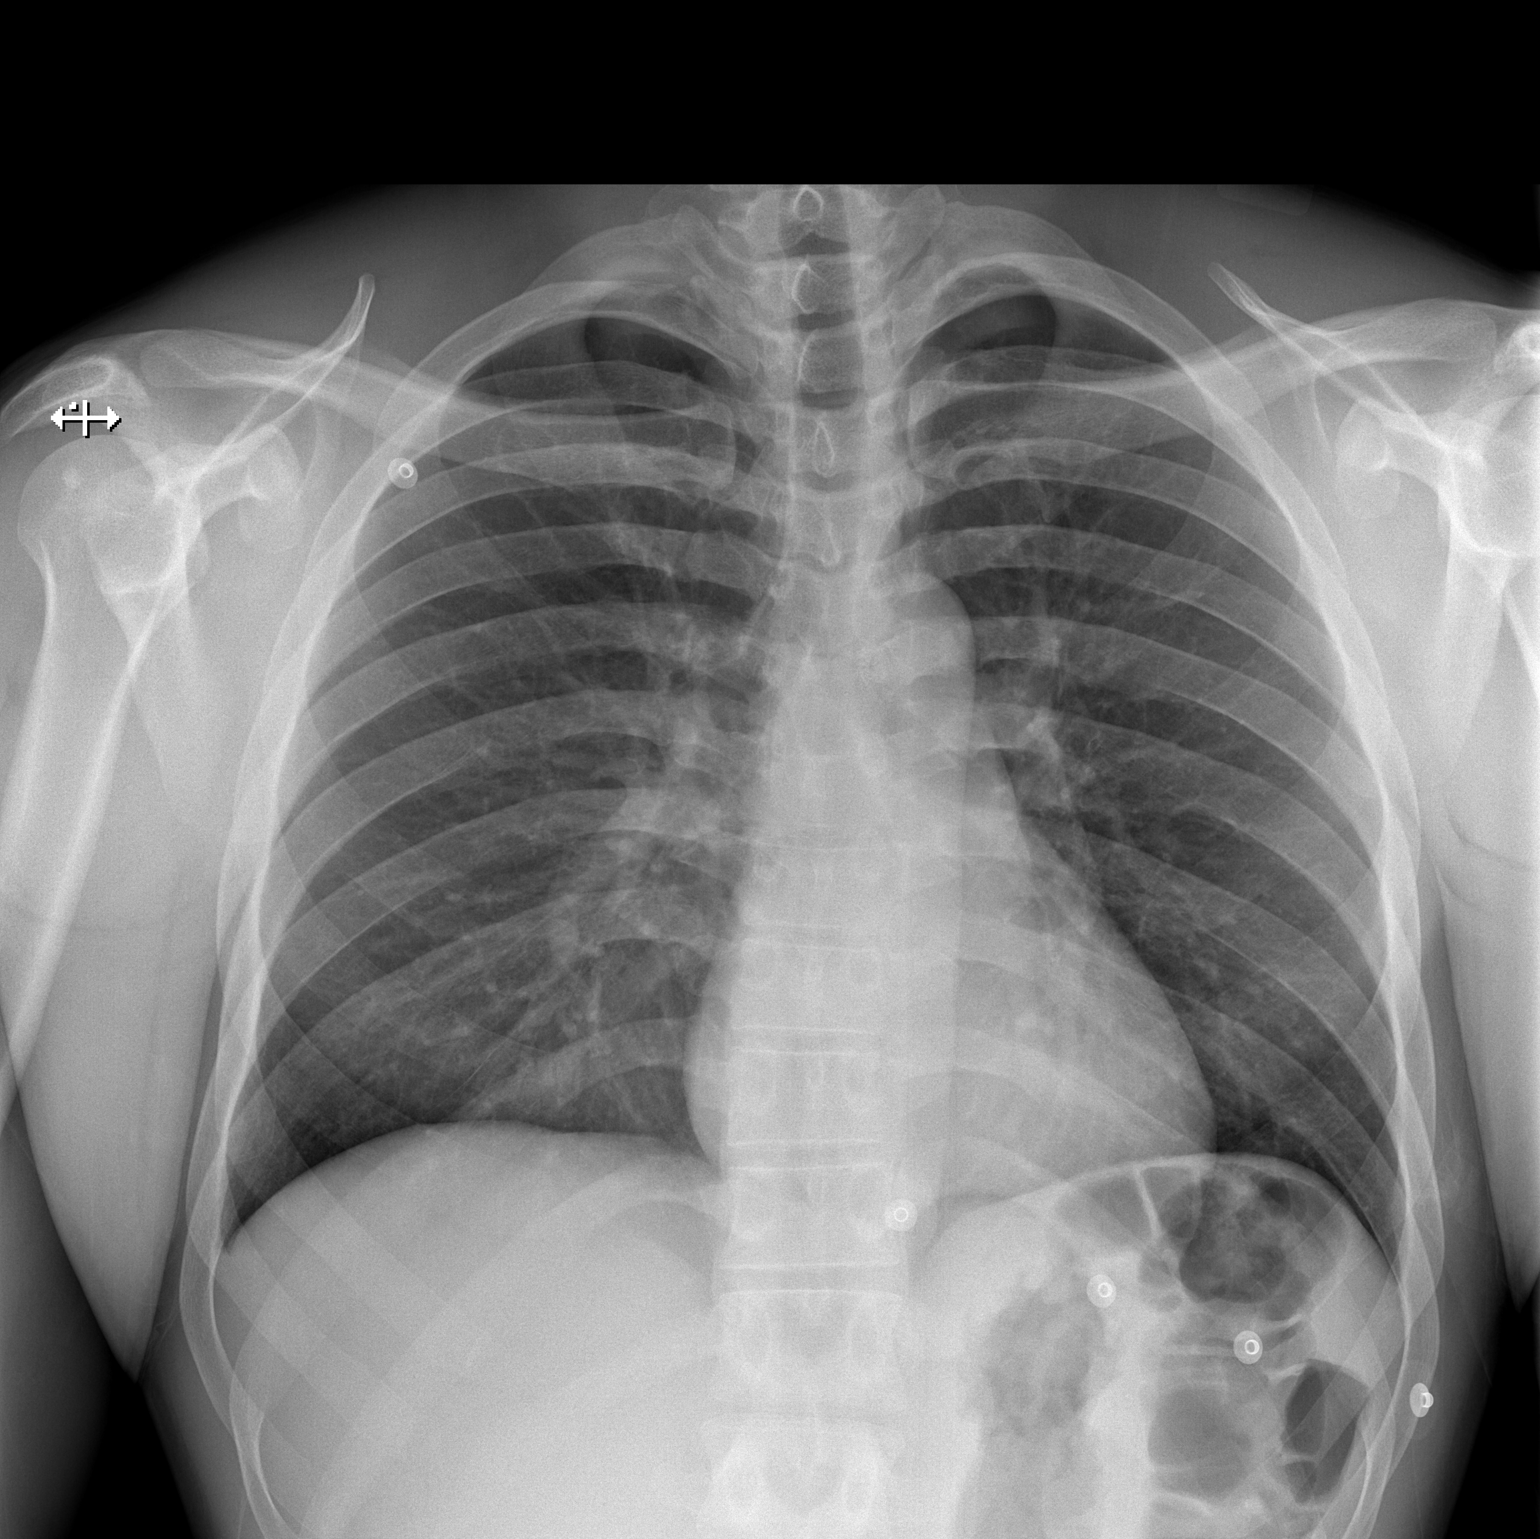

[w chest lat]
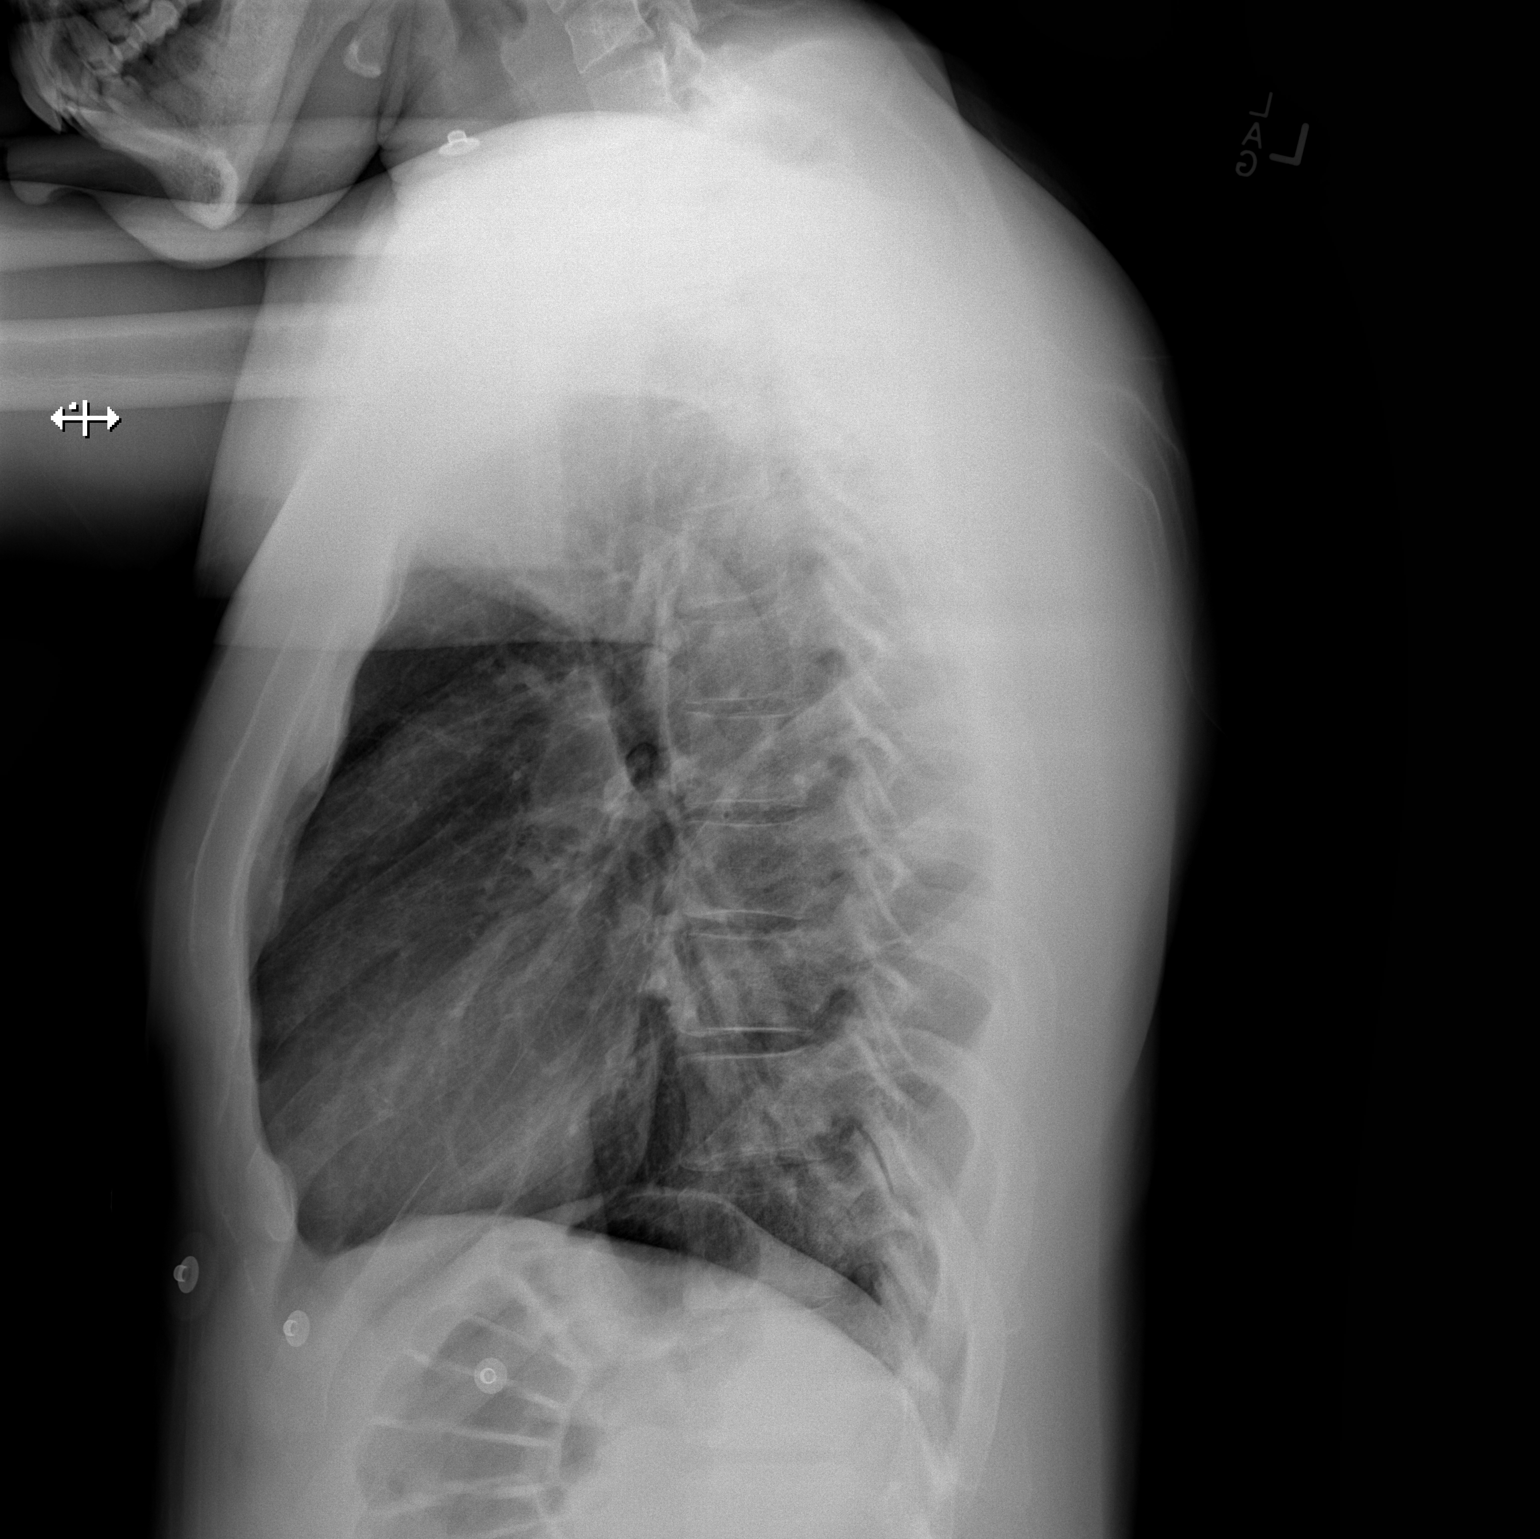

[2 of 2 positions shown; findings below may reference images not displayed]

FINDINGS: The heart size and mediastinal contours are within normal limits.
Both lungs are clear. The visualized skeletal structures are
unremarkable.
IMPRESSION: No active cardiopulmonary disease.
